# Patient Record
Sex: Male | Born: 2000 | Race: White | Hispanic: No | Marital: Single | Smoking: Never smoker
Health system: Southern US, Community
[De-identification: ages and names within clinical notes are randomized; demographics above are authoritative.]

## PROBLEM LIST (undated history)

## (undated) DIAGNOSIS — R859 Unspecified abnormal finding in specimens from digestive organs and abdominal cavity: Secondary | ICD-10-CM

## (undated) DIAGNOSIS — F800 Phonological disorder: Secondary | ICD-10-CM

## (undated) DIAGNOSIS — F419 Anxiety disorder, unspecified: Secondary | ICD-10-CM

## (undated) HISTORY — DX: Unspecified abnormal finding in specimens from digestive organs and abdominal cavity: R85.9

## (undated) HISTORY — DX: Phonological disorder: F80.0

## (undated) HISTORY — DX: Anxiety disorder, unspecified: F41.9

---
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
## 2020-01-29 ENCOUNTER — Ambulatory Visit: Payer: BC Managed Care – PPO | Admitting: Allergy

## 2020-01-29 NOTE — Progress Notes (Deleted)
New Patient Note  RE: Zachary Olson MRN: [id number] DOB: [birth year] Date of Office Visit: 01/29/2020  Referring provider: Georgiann Hahn, MD Primary care provider: Georgiann Hahn, MD  Chief Complaint: No chief complaint on file.  History of Present Illness: I had the pleasure of seeing Zachary Olson for initial evaluation at the [facility] on 01/29/2020. He is a 19 y.o. male, who is referred here by Georgiann Hahn, MD for the evaluation of asthma and allergy.  Last seen by Dr. Willa Rough in our office in 2015.  Asthma: He reports symptoms of *** chest tightness, shortness of breath, coughing, wheezing, nocturnal awakenings for *** years. Current medications include *** which help. He reports *** using aerochamber with inhalers. He tried the following inhalers: ***. Main triggers are ***allergies, infections, weather changes, smoke, exercise, pet exposure. In the last month, frequency of symptoms: ***x/week. Frequency of nocturnal symptoms: ***x/month. Frequency of SABA use: ***x/week. Interference with physical activity: ***. Sleep is ***disturbed. In the last 12 months, emergency room visits/urgent care visits/doctor office visits or hospitalizations due to respiratory issues: ***. In the last 12 months, oral steroids courses: ***. Lifetime history of hospitalization for respiratory issues: ***. Prior intubations: ***. Asthma was diagnosed at age *** by ***. History of pneumonia: ***. He was evaluated by allergist ***pulmonologist in the past. Smoking exposure: ***. Up to date with flu vaccine: ***. Up to date with pneumonia vaccine: ***. Up to date with COVID-19 vaccine: ***.  History of reflux: ***.  Rhinitis: He reports symptoms of ***. Symptoms have been going on for *** years. The symptoms are present *** all year around with worsening in ***. Other triggers include exposure to ***. Anosmia: ***. Headache: ***. He has used *** with ***fair improvement in symptoms.  Sinus infections: ***. Previous work up includes: ***. Previous ENT evaluation: ***. Previous sinus imaging: ***. History of nasal polyps: ***. Last eye exam: ***. History of reflux: ***.  Assessment and Plan: Zachary Olson is a 19 y.o. male with: No problem-specific Assessment & Plan notes found for this encounter.  No follow-ups on file.  No orders of the defined types were placed in this encounter.  Lab Orders  No laboratory test(s) ordered today    Other allergy screening: Asthma: {Blank single:19197::"yes","no"} Rhino conjunctivitis: {Blank single:19197::"yes","no"} Food allergy: {Blank single:19197::"yes","no"} Medication allergy: {Blank single:19197::"yes","no"} Hymenoptera allergy: {Blank single:19197::"yes","no"} Urticaria: {Blank single:19197::"yes","no"} Eczema:{Blank single:19197::"yes","no"} History of recurrent infections suggestive of immunodeficency: {Blank single:19197::"yes","no"}  Diagnostics: Spirometry:  Tracings reviewed. His effort: {Blank single:19197::"Good reproducible efforts.","It was hard to get consistent efforts and there is a question as to whether this reflects a maximal maneuver.","Poor effort, data can not be interpreted."} FVC: ***L FEV1: ***L, ***% predicted FEV1/FVC ratio: ***% Interpretation: {Blank single:19197::"Spirometry consistent with mild obstructive disease","Spirometry consistent with moderate obstructive disease","Spirometry consistent with severe obstructive disease","Spirometry consistent with possible restrictive disease","Spirometry consistent with mixed obstructive and restrictive disease","Spirometry uninterpretable due to technique","Spirometry consistent with normal pattern","No overt abnormalities noted given today's efforts"}.  Please see scanned spirometry results for details.  Skin Testing: {Blank single:19197::"Select foods","Environmental allergy panel","Environmental allergy panel and select foods","Food allergy  panel","None","Deferred due to recent antihistamines use"}. Positive test to: ***. Negative test to: ***.  Results discussed with patient/family.   Past Medical History: Patient Active Problem List   Diagnosis Date Noted  . Concussion with < 1 hr loss of consciousness 01/24/2014  . BMI (body mass index), pediatric, 5% to less than 85% for age 41/05/2013  . Closed fracture dislocation  of finger 01/21/2014  . Gastroesophageal reflux disease without esophagitis 01/07/2014  . Inadequate social skills 06/09/2013  . Language disorder involving understanding and expression of language 06/09/2013  . 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 Obsessive-compulsive symptoms 06/09/2013  . Picky eater 06/09/2013  . Acne 08/27/2012  . Ganglion cyst of wrist 08/27/2012  . Food allergy, peanut 01/11/2012  . Allergy to environmental factors 01/11/2012  . Allergic conjunctivitis 01/11/2012  . Allergic rhinitis 01/11/2012   Past Medical History:  Diagnosis Date  . Anxiety   . Articulation disorder   . Saliva abnormal    Past Surgical History: No past surgical history on file. Medication List:  Current Outpatient Medications  Medication Sig Dispense Refill  . calcium carbonate (TUMS - DOSED IN MG ELEMENTAL CALCIUM) 500 MG chewable tablet Chew 1 tablet by mouth daily as needed for indigestion or heartburn.    . cetirizine (ZYRTEC) 10 MG tablet Take 10 mg by mouth daily.    Marland Kitchen docusate sodium (COLACE) 100 MG capsule Take 2 capsules (200 mg total) by mouth 2 (two) times daily. 10 capsule 0  . EPINEPHrine (EPIPEN) 0.3 mg/0.3 mL DEVI Inject 0.3 mLs (0.3 mg total) into the muscle once. 1 Device   . mometasone (NASONEX) 50 MCG/ACT nasal spray Place 2 sprays into the nose daily.    Marland Kitchen omeprazole (PRILOSEC) 20 MG capsule Take 1 capsule (20 mg total) by mouth daily. 30 capsule 1   No current facility-administered medications for this visit.   Allergies: Allergies  Allergen Reactions  . Peanuts [Nuts] Shortness  Of Breath and Swelling    All Tree nuts also  . Egg White Hives, Itching, Swelling and Rash  . Grassleaf Sweetflag Rhizome Hives, Itching, Swelling and Rash  . Neosporin [Neomycin-Bacitracin Zn-Polymyx] Hives, Itching, Swelling and Rash  . Pollen Extract-Tree Extract Hives, Itching, Swelling and Rash   Social History: Social History   Socioeconomic History  . Marital status: Single    Spouse name: Not on file  . Number of children: Not on file  . Years of education: Not on file  . Highest education level: Not on file  Occupational History  . Not on file  Tobacco Use  . Smoking status: Never Smoker  . Smokeless tobacco: Never Used  Substance and Sexual Activity  . Alcohol use: Not on file  . Drug use: No  . Sexual activity: Never  Other Topics Concern  . Not on file  Social History Narrative  . Not on file   Social Determinants of Health   Financial Resource Strain:   . Difficulty of Paying Living Expenses: Not on file  Food Insecurity:   . Worried About Programme researcher, broadcasting/film/video in the Last Year: Not on file  . Ran Out of Food in the Last Year: Not on file  Transportation Needs:   . Lack of Transportation (Medical): Not on file  . Lack of Transportation (Non-Medical): Not on file  Physical Activity:   . Days of Exercise per Week: Not on file  . Minutes of Exercise per Session: Not on file  Stress:   . Feeling of Stress : Not on file  Social Connections:   . Frequency of Communication with Friends and Family: Not on file  . Frequency of Social Gatherings with Friends and Family: Not on file  . Attends Religious Services: Not on file  . Active Member of Clubs or Organizations: Not on file  . Attends Banker Meetings: Not on file  .  Marital Status: Not on file   Lives in a ***. Smoking: *** Occupation: ***  Environmental HistorySurveyor, minerals in the house: Copywriter, advertising in the family room: {Blank  single:19197::"yes","no"} Carpet in the bedroom: {Blank single:19197::"yes","no"} Heating: {Blank single:19197::"electric","gas"} Cooling: {Blank single:19197::"central","window"} Pet: {Blank single:19197::"yes ***","no"}  Family History: No family history on file. Problem                               Relation Asthma                                   *** Eczema                                *** Food allergy                          *** Allergic rhino conjunctivitis     ***  Review of Systems  Constitutional: Negative for appetite change, chills, fever and unexpected weight change.  HENT: Negative for congestion and rhinorrhea.   Eyes: Negative for itching.  Respiratory: Negative for cough, chest tightness, shortness of breath and wheezing.   Cardiovascular: Negative for chest pain.  Gastrointestinal: Negative for abdominal pain.  Genitourinary: Negative for difficulty urinating.  Skin: Negative for rash.  Neurological: Negative for headaches.   Objective: There were no vitals taken for this visit. There is no height or weight on file to calculate BMI. Physical Exam Vitals and nursing note reviewed.  Constitutional:      Appearance: Normal appearance. He is well-developed.  HENT:     Head: Normocephalic and atraumatic.     Right Ear: External ear normal.     Left Ear: External ear normal.     Nose: Nose normal.     Mouth/Throat:     Mouth: Mucous membranes are moist.     Pharynx: Oropharynx is clear.  Eyes:     Conjunctiva/sclera: Conjunctivae normal.  Cardiovascular:     Rate and Rhythm: Normal rate and regular rhythm.     Heart sounds: Normal heart sounds. No murmur heard.  No friction rub. No gallop.   Pulmonary:     Effort: Pulmonary effort is normal.     Breath sounds: Normal breath sounds. No wheezing, rhonchi or rales.  Abdominal:     Palpations: Abdomen is soft.  Musculoskeletal:     Cervical back: Neck supple.  Skin:    General: Skin is warm.      Findings: No rash.  Neurological:     Mental Status: He is alert and oriented to person, place, and time.  Psychiatric:        Behavior: Behavior normal.    The plan was reviewed with the patient/family, and all questions/concerned were addressed.  It was my pleasure to see Zachary Olson today and participate in his care. Please feel free to contact me with any questions or concerns.  Sincerely,  Wyline Mood, DO Allergy & Immunology  [facility] office: [phone] [facility] office: [phone]

## 2021-04-22 IMAGING — DX ABDOMEN - 1 VIEW
2 series · 6 of 6 positions shown · non-contrast
Comparison: 01/07/2014

CLINICAL DATA: Generalized abdominal pain, constipation

EXAM:
ABDOMEN - 1 VIEW

[Series 1: dg abd 1 view · U · 0.14mm/px · 3 of 3 slices shown (1 of 2)]
[im 1/3]
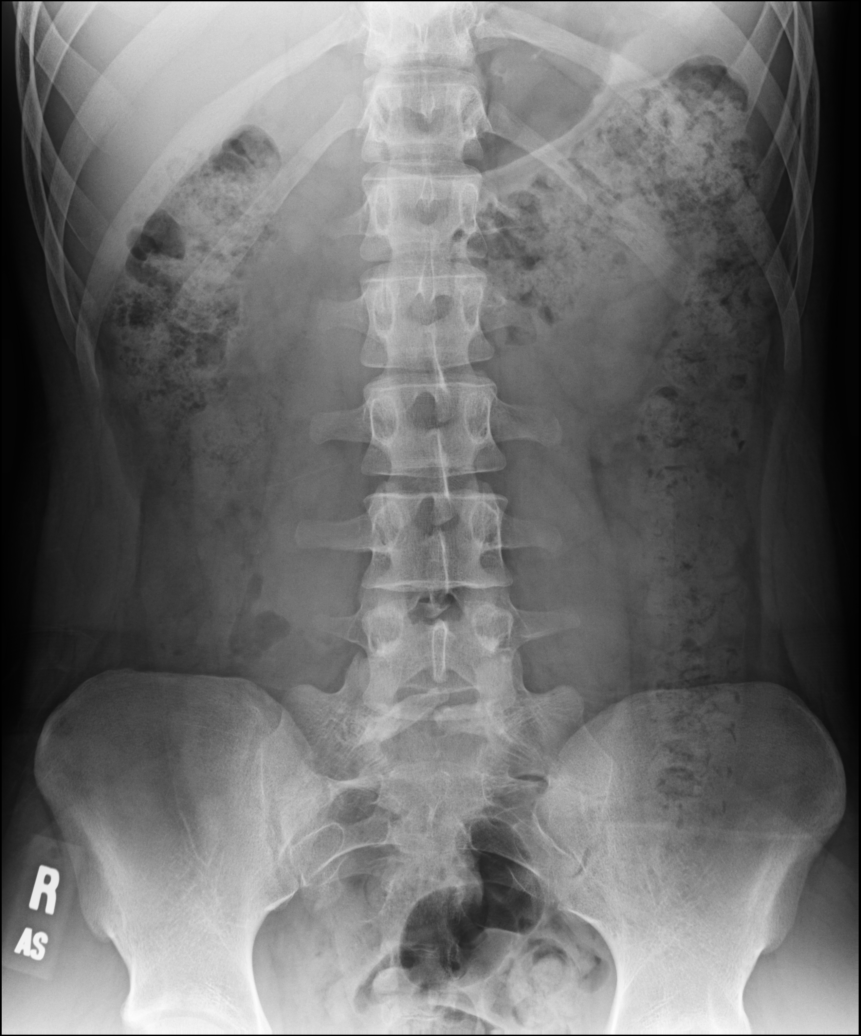
[im 2/3]
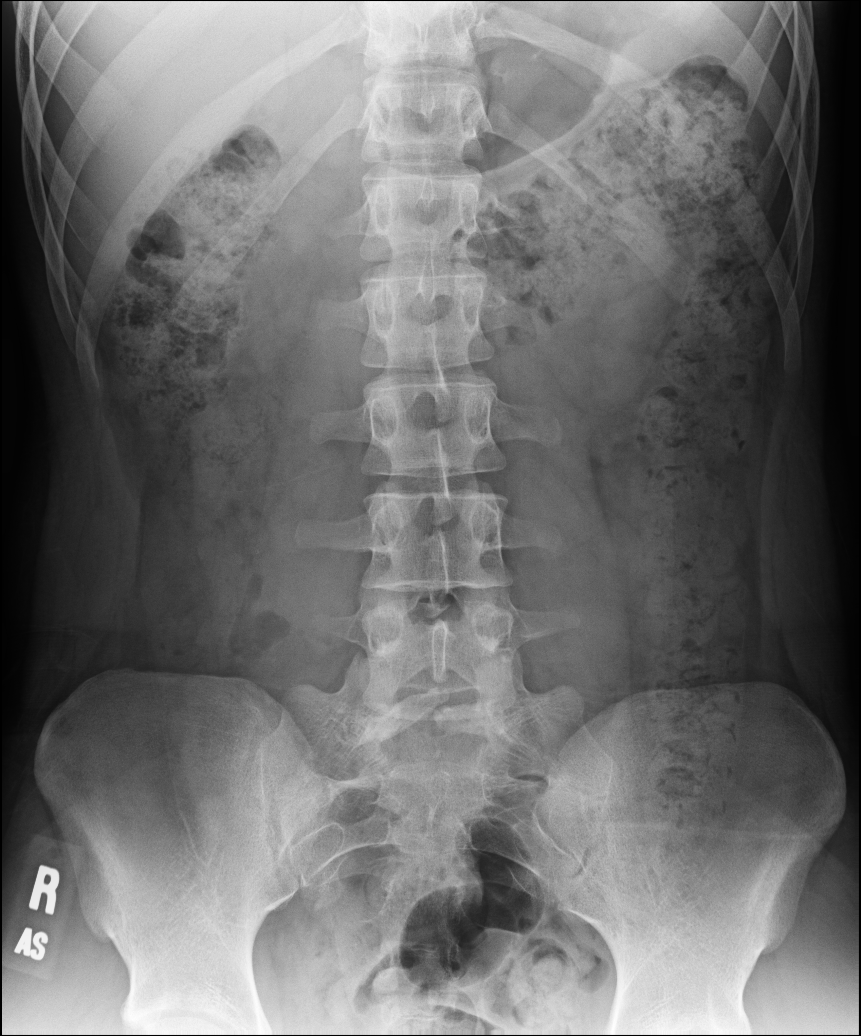
[im 3/3]
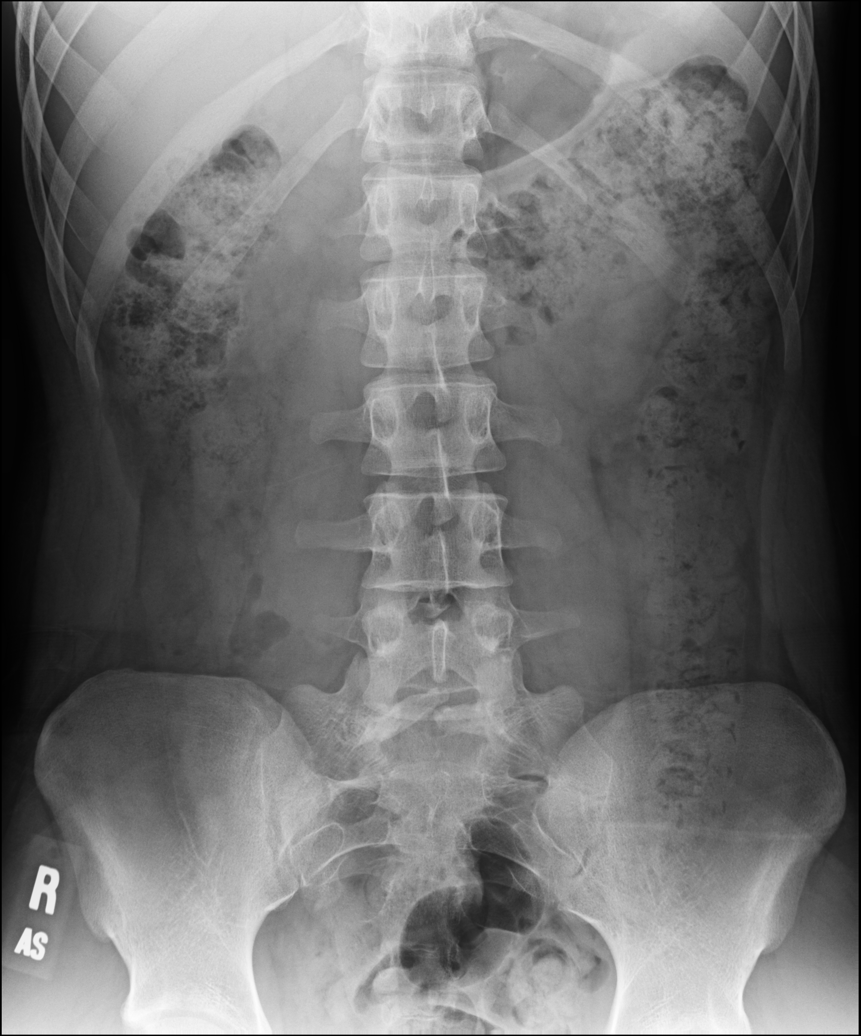

[Series 2: dg abd 1 view · U · 0.14mm/px · 3 of 3 slices shown (2 of 2)]
[im 1/3]
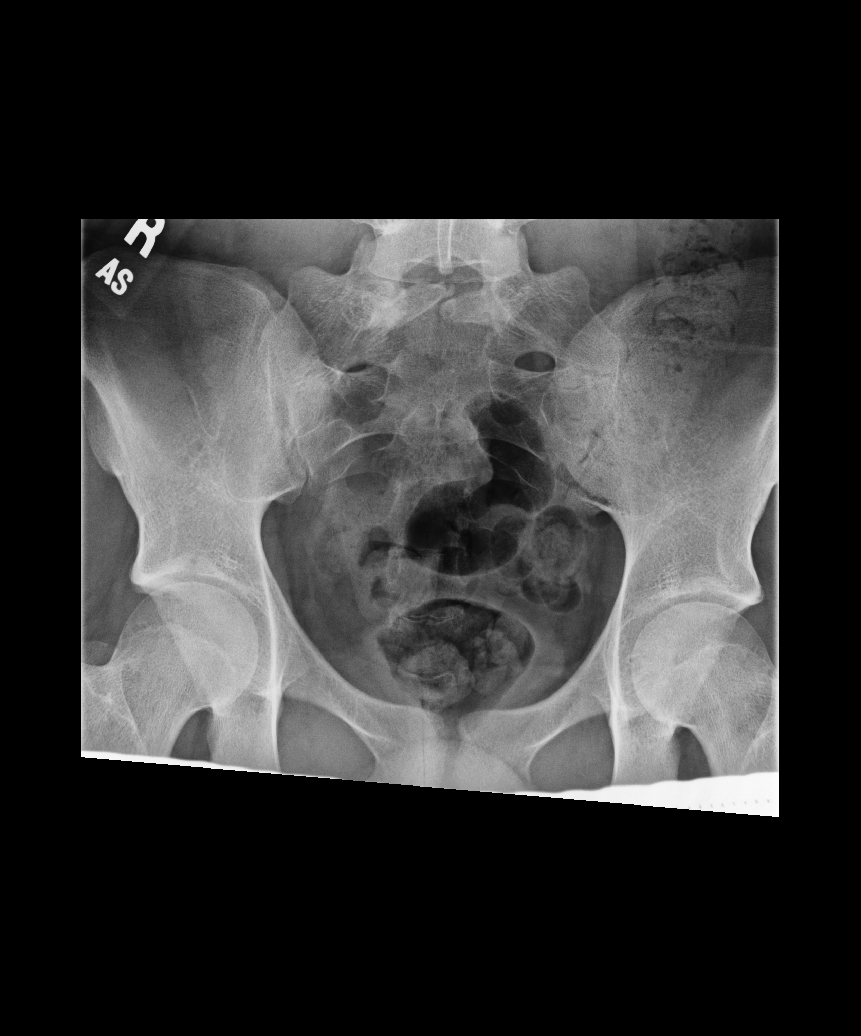
[im 2/3]
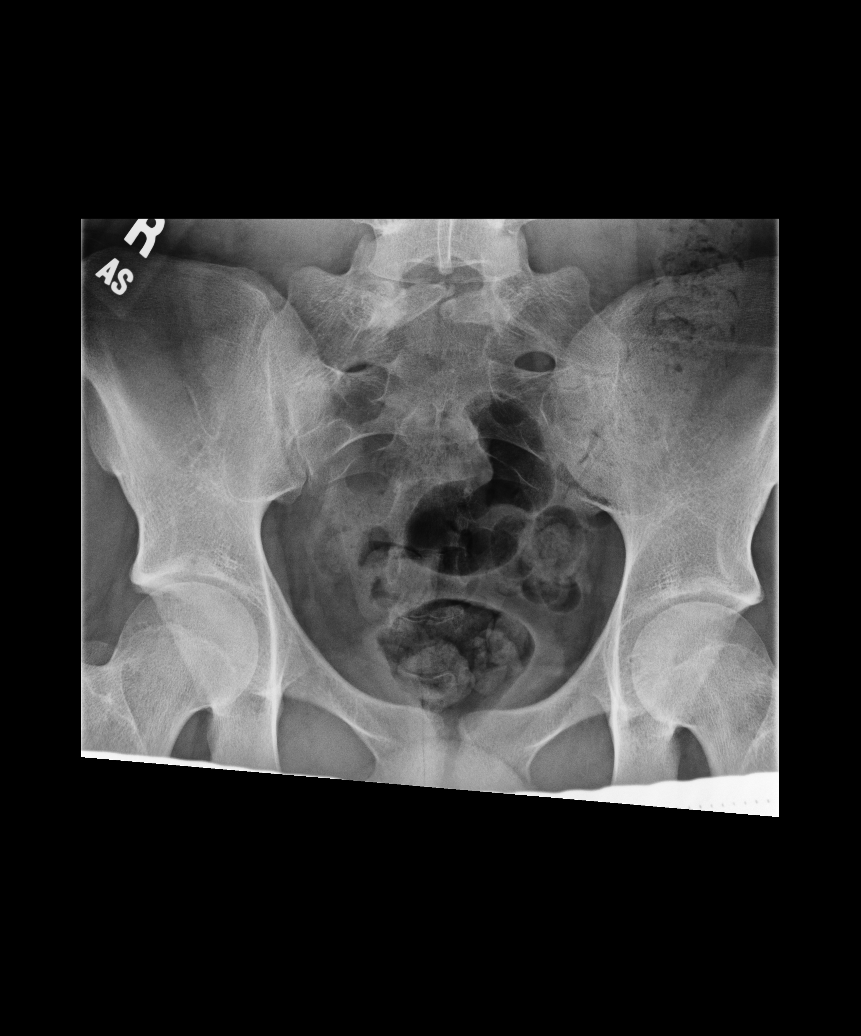
[im 3/3]
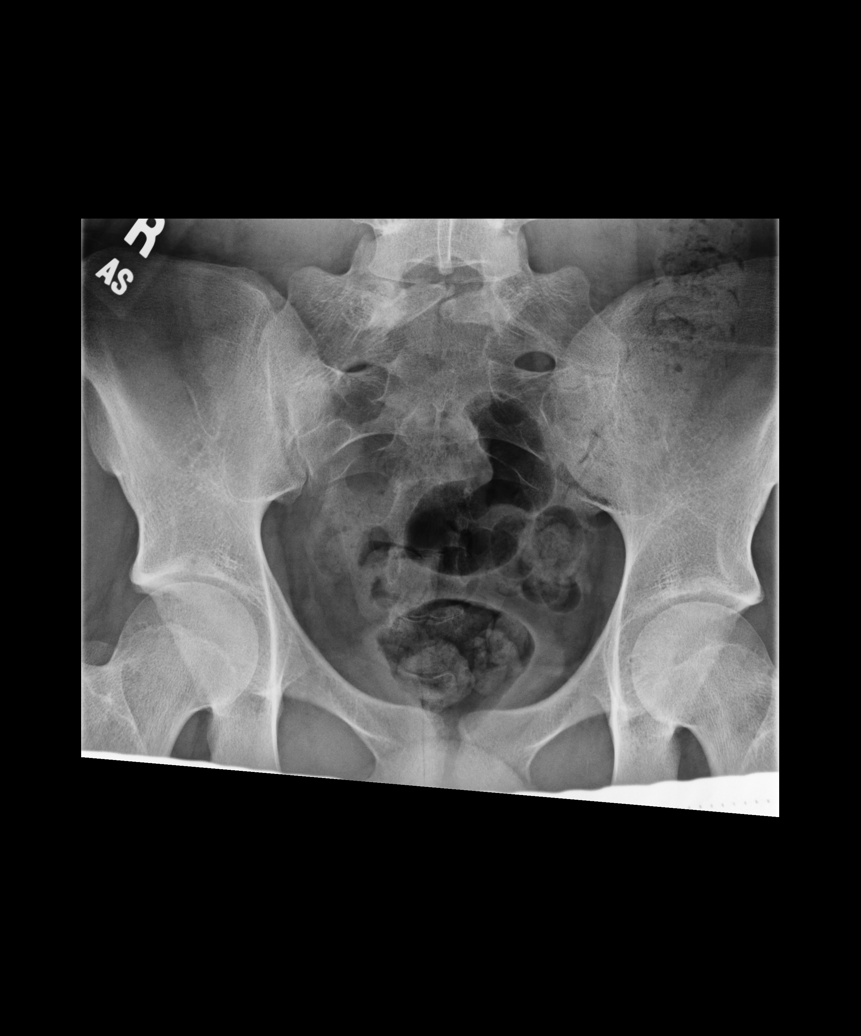

[6 of 6 positions shown; findings below may reference images not displayed]

FINDINGS: Large stool burden throughout the colon. There is a non obstructive
bowel gas pattern. No supine evidence of free air. No organomegaly
or suspicious calcification. No acute bony abnormality.
IMPRESSION: Large stool burden suggesting constipation.  No acute findings.
